# Patient Record
Sex: Male | Born: 1970 | Race: White | Hispanic: No | Smoking: Current every day smoker
Health system: Southern US, Community
[De-identification: ages and names within clinical notes are randomized; demographics above are authoritative.]

## PROBLEM LIST (undated history)

## (undated) HISTORY — PX: FRACTURE SURGERY: SHX138

---

## 2002-10-06 ENCOUNTER — Emergency Department (HOSPITAL_COMMUNITY): Admission: EM | Admit: 2002-10-06 | Discharge: 2002-10-06 | Payer: Self-pay | Admitting: *Deleted

## 2002-10-06 ENCOUNTER — Encounter: Payer: Self-pay | Admitting: *Deleted

## 2002-10-27 ENCOUNTER — Encounter: Payer: Self-pay | Admitting: Emergency Medicine

## 2002-10-27 ENCOUNTER — Ambulatory Visit (HOSPITAL_COMMUNITY): Admission: RE | Admit: 2002-10-27 | Discharge: 2002-10-27 | Payer: Self-pay | Admitting: Emergency Medicine

## 2003-05-31 ENCOUNTER — Emergency Department (HOSPITAL_COMMUNITY): Admission: EM | Admit: 2003-05-31 | Discharge: 2003-05-31 | Payer: Self-pay | Admitting: Emergency Medicine

## 2004-02-28 ENCOUNTER — Ambulatory Visit (HOSPITAL_COMMUNITY): Admission: RE | Admit: 2004-02-28 | Discharge: 2004-02-28 | Payer: Self-pay | Admitting: Internal Medicine

## 2014-10-29 ENCOUNTER — Emergency Department (HOSPITAL_COMMUNITY): Payer: Self-pay

## 2014-10-29 ENCOUNTER — Emergency Department (HOSPITAL_COMMUNITY): Payer: No Typology Code available for payment source

## 2014-10-29 ENCOUNTER — Emergency Department (HOSPITAL_COMMUNITY)
Admission: EM | Admit: 2014-10-29 | Discharge: 2014-10-29 | Disposition: A | Discharge status: Home / Self Care | Payer: Self-pay | Attending: Emergency Medicine | Admitting: Emergency Medicine

## 2014-10-29 ENCOUNTER — Encounter (HOSPITAL_COMMUNITY): Payer: Self-pay | Admitting: *Deleted

## 2014-10-29 DIAGNOSIS — Z720 Tobacco use: Secondary | ICD-10-CM | POA: Insufficient documentation

## 2014-10-29 DIAGNOSIS — M545 Low back pain, unspecified: Secondary | ICD-10-CM

## 2014-10-29 DIAGNOSIS — S3992XA Unspecified injury of lower back, initial encounter: Secondary | ICD-10-CM | POA: Insufficient documentation

## 2014-10-29 DIAGNOSIS — Y998 Other external cause status: Secondary | ICD-10-CM | POA: Insufficient documentation

## 2014-10-29 DIAGNOSIS — Y92410 Unspecified street and highway as the place of occurrence of the external cause: Secondary | ICD-10-CM | POA: Insufficient documentation

## 2014-10-29 DIAGNOSIS — Y9389 Activity, other specified: Secondary | ICD-10-CM | POA: Insufficient documentation

## 2014-10-29 MED ORDER — OXYCODONE-ACETAMINOPHEN 5-325 MG PO TABS
2.0000 | ORAL_TABLET | Freq: Once | ORAL | Status: AC
  Administered 2014-10-29: 2 via ORAL
  Filled 2014-10-29: qty 2

## 2014-10-29 MED ORDER — IBUPROFEN 400 MG PO TABS
600.0000 mg | ORAL_TABLET | Freq: Once | ORAL | Status: AC
  Administered 2014-10-29: 600 mg via ORAL
  Filled 2014-10-29: qty 2

## 2014-10-29 MED ORDER — TRAMADOL HCL 50 MG PO TABS: 50.0000 mg | ORAL_TABLET | Freq: Four times a day (QID) | ORAL | Status: DC | PRN

## 2014-10-29 MED ORDER — DIAZEPAM 5 MG PO TABS
5.0000 mg | ORAL_TABLET | Freq: Once | ORAL | Status: AC
  Administered 2014-10-29: 5 mg via ORAL
  Filled 2014-10-29: qty 1
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
## 2014-10-29 NOTE — ED Notes (Signed)
Patient transported to MRI 
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
## 2014-10-29 NOTE — ED Notes (Signed)
Pt in gown , given slippers for MRI.

## 2016-02-09 ENCOUNTER — Emergency Department (HOSPITAL_COMMUNITY)
Admission: EM | Admit: 2016-02-09 | Discharge: 2016-02-09 | Disposition: A | Discharge status: Home / Self Care | Payer: 59 | Attending: Emergency Medicine | Admitting: Emergency Medicine

## 2016-02-09 ENCOUNTER — Emergency Department (HOSPITAL_COMMUNITY): Payer: 59

## 2016-02-09 ENCOUNTER — Encounter (HOSPITAL_COMMUNITY): Payer: Self-pay | Admitting: Emergency Medicine

## 2016-02-09 DIAGNOSIS — M25561 Pain in right knee: Secondary | ICD-10-CM | POA: Insufficient documentation

## 2016-02-09 DIAGNOSIS — Y999 Unspecified external cause status: Secondary | ICD-10-CM | POA: Diagnosis not present

## 2016-02-09 DIAGNOSIS — Y939 Activity, unspecified: Secondary | ICD-10-CM | POA: Insufficient documentation

## 2016-02-09 DIAGNOSIS — S8991XA Unspecified injury of right lower leg, initial encounter: Secondary | ICD-10-CM | POA: Diagnosis present

## 2016-02-09 DIAGNOSIS — F17210 Nicotine dependence, cigarettes, uncomplicated: Secondary | ICD-10-CM | POA: Insufficient documentation

## 2016-02-09 DIAGNOSIS — X58XXXA Exposure to other specified factors, initial encounter: Secondary | ICD-10-CM | POA: Insufficient documentation

## 2016-02-09 DIAGNOSIS — Y929 Unspecified place or not applicable: Secondary | ICD-10-CM | POA: Diagnosis not present

## 2016-02-09 MED ORDER — IBUPROFEN 400 MG PO TABS: 400.0000 mg | ORAL_TABLET | Freq: Four times a day (QID) | ORAL | 0 refills | Status: AC | PRN

## 2016-02-09 MED ORDER — OXYCODONE-ACETAMINOPHEN 5-325 MG PO TABS
1.0000 | ORAL_TABLET | Freq: Three times a day (TID) | ORAL | 0 refills | Status: DC | PRN
Start: 2016-02-09 — End: 2016-02-11
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
## 2016-02-09 NOTE — ED Provider Notes (Signed)
[facility]-EMERGENCY DEPT Provider Note   CSN: [id number] Arrival date & time: 02/09/16  [id number]  History   Chief Complaint No chief complaint on file.   HPI Tyrone Rodriguez is a 45 y.o. male.  HPI  45 y.o. male presents to the Emergency Department today complaining of right knee pain x 1 week. Notes worsening of pain with ambulation. Minimal pain at rest. No fevers. No N/V. No numbness/tingling. Notes swelling occasionally, but this resolves. No redness around knee. States that on occasions he feels a "pop" in his knee and is able to "pop it back in" without difficulty and improvement of pain. Notes minimal relief with OTC remedies. No other symptoms noted.    No past medical history on file.  There are no active problems to display for this patient.   No past surgical history on file.     Home Medications    Prior to Admission medications   Medication Sig Start Date End Date Taking? Authorizing Provider  traMADol (ULTRAM) 50 MG tablet Take 1 tablet (50 mg total) by mouth every 6 (six) hours as needed. 10/29/14   Raeford RazorStephen Kohut, MD    Family History No family history on file.  Social History Social History  Substance Use Topics  . Smoking status: Current Every Day Smoker    Packs/day: 0.50    Types: Cigarettes  . Smokeless tobacco: Not on file  . Alcohol use Yes     Comment: occ     Allergies   Shellfish allergy   Review of Systems Review of Systems  Constitutional: Negative for fever.  Gastrointestinal: Negative for nausea and vomiting.  Musculoskeletal: Positive for arthralgias.  Skin: Negative for rash and wound.  Neurological: Negative for numbness.   Physical Exam Updated Vital Signs There were no vitals taken for this visit.  Physical Exam  Constitutional: He is oriented to person, place, and time. Vital signs are normal. He appears well-developed and well-nourished.  HENT:  Head: Normocephalic.  Right Ear: Hearing normal.  Left Ear: Hearing normal.    Eyes: Conjunctivae and EOM are normal. Pupils are equal, round, and reactive to light.  Neck: Normal range of motion. Neck supple.  Cardiovascular: Normal rate and regular rhythm.   Pulmonary/Chest: Effort normal.  Musculoskeletal:  Right knee with no obvious swelling. No palpable or visible deformities. No skin change. NVI. Motor/sensation intact. Negative anterior/poster drawer bilaterally. Negative ballottement test. No varus or valgus laxity. No crepitus. Pain with extension/flexion. Positive McMurray.  Neurological: He is alert and oriented to person, place, and time.  Skin: Skin is warm and dry.  Psychiatric: He has a normal mood and affect. His speech is normal and behavior is normal. Thought content normal.  Nursing note and vitals reviewed.  ED Treatments / Results  Labs (all labs ordered are listed, but only abnormal results are displayed) Labs Reviewed - No data to display  EKG  EKG Interpretation None       Radiology Dg Knee Complete 4 Views Right  Result Date: 02/09/2016 CLINICAL DATA:  Right knee pain for the past 3 days. No known injury. EXAM: RIGHT KNEE - COMPLETE 4+ VIEW COMPARISON:  None. FINDINGS: Intra medullary rod and screw fixation of the femur. Minimal spur formation involving all 3 joint compartments. Moderate-sized effusion. IMPRESSION: Minimal degenerative changes and moderate-sized effusion. Electronically Signed   By: Beckie SaltsSteven  Reid M.D.   On: 02/09/2016 10:21    Procedures Procedures (including critical care time)  Medications Ordered in ED Medications - No  data to display   Initial Impression / Assessment and Plan / ED Course  I have reviewed the triage vital signs and the nursing notes.  Pertinent labs & imaging results that were available during my care of the patient were reviewed by me and considered in my medical decision making (see chart for details).  Clinical Course    Final Clinical Impressions(s) / ED Diagnoses   {I have reviewed  and evaluated the relevant imaging studies.  {I have reviewed the relevant previous healthcare records.  {I obtained HPI from historian.   ED Course:  Assessment: Patient X-Ray negative for obvious fracture or dislocation. Likely meniscal tear on medial aspect. Pt advised to follow up with orthopedics. Patient given brace while in ED, conservative therapy recommended and discussed. Patient will be discharged home & is agreeable with above plan. Returns precautions discussed. Pt appears safe for discharge.  Disposition/Plan:  DC Home Additional Verbal discharge instructions given and discussed with patient.  Pt Instructed to f/u with Ortho in the next week for evaluation and treatment of symptoms. Return precautions given Pt acknowledges and agrees with plan  Supervising Physician Bethann BerkshireJoseph Zammit, MD  Final diagnoses:  Acute pain of right knee    New Prescriptions New Prescriptions   No medications on file     Audry Piliyler Saoirse Legere, PA-C 02/09/16 1053    Bethann BerkshireJoseph Zammit, MD 02/11/16 [phone]

## 2016-02-09 NOTE — ED Triage Notes (Signed)
Pt c/o right knee pain since Thursday. Denies injury.

## 2016-02-11 ENCOUNTER — Encounter: Payer: Self-pay | Admitting: Orthopaedic Surgery

## 2016-02-11 ENCOUNTER — Ambulatory Visit (INDEPENDENT_AMBULATORY_CARE_PROVIDER_SITE_OTHER): Payer: 59 | Admitting: Orthopaedic Surgery

## 2016-02-11 VITALS — BP 157/94 | HR 79 | Temp 97.2°F | Ht 70.0 in | Wt 278.0 lb

## 2016-02-11 DIAGNOSIS — M25561 Pain in right knee: Secondary | ICD-10-CM

## 2016-02-11 DIAGNOSIS — F17210 Nicotine dependence, cigarettes, uncomplicated: Secondary | ICD-10-CM

## 2016-02-11 MED ORDER — OXYCODONE-ACETAMINOPHEN 5-325 MG PO TABS: 1.0000 | ORAL_TABLET | Freq: Four times a day (QID) | ORAL | 0 refills | Status: DC | PRN

## 2016-02-11 NOTE — Progress Notes (Signed)
Subjective:    Patient ID: Tyrone Rodriguez, male    DOB: 1970/06/01, 45 y.o.   MRN: [id number]  HPI He took a misstep about 3 days ago and twisted his knee and fell.  He has had swelling and pain since then.  He was seen in the ER on 02-09-16.  X-rays were done and showed effusion but no fracture.  He was given pain medicine and ibuprofen.  His pain continues.  He is not able to fully extend his knee.  He is limping.  He has large effusion.  He has pain at rest.  He has no other injury.  I have reviewed the ER report, x-rays and x-ray report.  I feel he has a torn meniscus medially.  I have recommended a MRI.  He smokes and is not interested in stopping.  I have encouraged him to reconsider this.   Review of Systems  HENT: Negative for congestion.   Respiratory: Negative for cough and shortness of breath.   Cardiovascular: Negative for chest pain and leg swelling.  Endocrine: Negative for cold intolerance.  Musculoskeletal: Positive for arthralgias, gait problem and joint swelling.  Allergic/Immunologic: Negative for environmental allergies.   History reviewed. No pertinent past medical history.  Past Surgical History:  Procedure Laterality Date  . FRACTURE SURGERY      Current Outpatient Prescriptions on File Prior to Visit  Medication Sig Dispense Refill  . ibuprofen (ADVIL,MOTRIN) 400 MG tablet Take 1 tablet (400 mg total) by mouth every 6 (six) hours as needed. 30 tablet 0   No current facility-administered medications on file prior to visit.     Social History   Social History  . Marital status: Divorced    Spouse name: N/A  . Number of children: N/A  . Years of education: N/A   Occupational History  . Not on file.   Social History Main Topics  . Smoking status: Current Every Day Smoker    Packs/day: 0.50    Types: Cigarettes  . Smokeless tobacco: Never Used  . Alcohol use Yes     Comment: occ  . Drug use: No  . Sexual activity: Not on file   Other  Topics Concern  . Not on file   Social History Narrative  . No narrative on file    Family history of hypertension and heart disease.  BP (!) 157/94   Pulse 79   Temp 97.2 F (36.2 C)   Ht 5\' 10"  (1.778 m)   Wt 278 lb (126.1 kg)   BMI 39.89 kg/m       Objective:   Physical Exam  Constitutional: He is oriented to person, place, and time. He appears well-developed and well-nourished.  HENT:  Head: Normocephalic and atraumatic.  Eyes: Conjunctivae and EOM are normal. Pupils are equal, round, and reactive to light.  Neck: Normal range of motion. Neck supple.  Cardiovascular: Normal rate, regular rhythm and intact distal pulses.   Pulmonary/Chest: Effort normal.  Abdominal: Soft.  Musculoskeletal: He exhibits tenderness (Pain right knee with very large effusion.  ROM 5 to 90 with pain, unable to have full extension.  Limip to the right.  Positive medial McMurray.  Knee is stable.  Left knee negative.).  Neurological: He is alert and oriented to person, place, and time. He has normal reflexes. No cranial nerve deficit. He exhibits normal muscle tone. Coordination normal.  Skin: Skin is warm and dry.  Psychiatric: He has a normal mood and affect. His behavior is  normal. Judgment and thought content normal.          Assessment & Plan:   Encounter Diagnoses  Name Primary?  . Acute pain of right knee Yes  . Cigarette nicotine dependence without complication    PROCEDURE NOTE:  The patient request injection, verbal consent was obtained.  The right knee was prepped appropriately after time out was performed.   Sterile technique was observed and anesthesia was provided by ethyl chloride and a 20-gauge needle was used to inject the knee area.  A 16-gauge needle was then used to aspirate the knee.  Color of fluid aspirated was slightly blood tinged  Total cc's aspirated was 65.    Injection of 1 cc of Depo-Medrol 40 mg with several cc's of plain xylocaine was then  performed.  A band aid dressing was applied.  The patient was advised to apply ice later today and tomorrow to the injection sight as needed.   I feel he has a torn medial meniscus of the knee.  He has large effusion, inability to fully extend the knee even after aspiration of the knee, he has positive Medial McMurray, and recent injury.  I feel he needs MRI.  He will need surgery if the MRI supports my diagnosis.    He is to get some crutches.  I have given medicine for pain. Continue the Ibuprofen.  Return after the MRI.  Call if any problem.  Precautions given.  Electronically Signed Darreld McleanWayne Ireene Ballowe, MD 12/5/20172:24 PM

## 2016-02-11 NOTE — Patient Instructions (Signed)
Steps to Quit Smoking Smoking tobacco can be bad for your health. It can also affect almost every organ in your body. Smoking puts you and people around you at risk for many serious Sallye Lunz-lasting (chronic) diseases. Quitting smoking is hard, but it is one of the best things that you can do for your health. It is never too late to quit. What are the benefits of quitting smoking? When you quit smoking, you lower your risk for getting serious diseases and conditions. They can include:  Lung cancer or lung disease.  Heart disease.  Stroke.  Heart attack.  Not being able to have children (infertility).  Weak bones (osteoporosis) and broken bones (fractures). If you have coughing, wheezing, and shortness of breath, those symptoms may get better when you quit. You may also get sick less often. If you are pregnant, quitting smoking can help to lower your chances of having a baby of low birth weight. What can I do to help me quit smoking? Talk with your doctor about what can help you quit smoking. Some things you can do (strategies) include:  Quitting smoking totally, instead of slowly cutting back how much you smoke over a period of time.  Going to in-person counseling. You are more likely to quit if you go to many counseling sessions.  Using resources and support systems, such as:  Online chats with a counselor.  Phone quitlines.  Printed self-help materials.  Support groups or group counseling.  Text messaging programs.  Mobile phone apps or applications.  Taking medicines. Some of these medicines may have nicotine in them. If you are pregnant or breastfeeding, do not take any medicines to quit smoking unless your doctor says it is okay. Talk with your doctor about counseling or other things that can help you. Talk with your doctor about using more than one strategy at the same time, such as taking medicines while you are also going to in-person counseling. This can help make quitting  easier. What things can I do to make it easier to quit? Quitting smoking might feel very hard at first, but there is a lot that you can do to make it easier. Take these steps:  Talk to your family and friends. Ask them to support and encourage you.  Call phone quitlines, reach out to support groups, or work with a counselor.  Ask people who smoke to not smoke around you.  Avoid places that make you want (trigger) to smoke, such as:  Bars.  Parties.  Smoke-break areas at work.  Spend time with people who do not smoke.  Lower the stress in your life. Stress can make you want to smoke. Try these things to help your stress:  Getting regular exercise.  Deep-breathing exercises.  Yoga.  Meditating.  Doing a body scan. To do this, close your eyes, focus on one area of your body at a time from head to toe, and notice which parts of your body are tense. Try to relax the muscles in those areas.  Download or buy apps on your mobile phone or tablet that can help you stick to your quit plan. There are many free apps, such as QuitGuide from the CDC (Centers for Disease Control and Prevention). You can find more support from smokefree.gov and other websites. This information is not intended to replace advice given to you by your health care provider. Make sure you discuss any questions you have with your health care provider. Document Released: 12/20/2008 Document Revised: 10/22/2015 Document   Reviewed: 07/10/2014 Elsevier Interactive Patient Education  2017 [employer]  

## 2016-02-13 ENCOUNTER — Ambulatory Visit (HOSPITAL_COMMUNITY): Admission: RE | Admit: 2016-02-13 | Payer: 59 | Source: Ambulatory Visit

## 2016-02-18 ENCOUNTER — Ambulatory Visit: Payer: 59 | Admitting: Orthopaedic Surgery

## 2017-10-01 IMAGING — DX DG KNEE COMPLETE 4+V*R*
4 series · 4 of 4 positions shown · non-contrast
Comparison: None.

CLINICAL DATA: Right knee pain for the past 3 days. No known
injury.

EXAM:
RIGHT KNEE - COMPLETE 4+ VIEW

[knee ap]
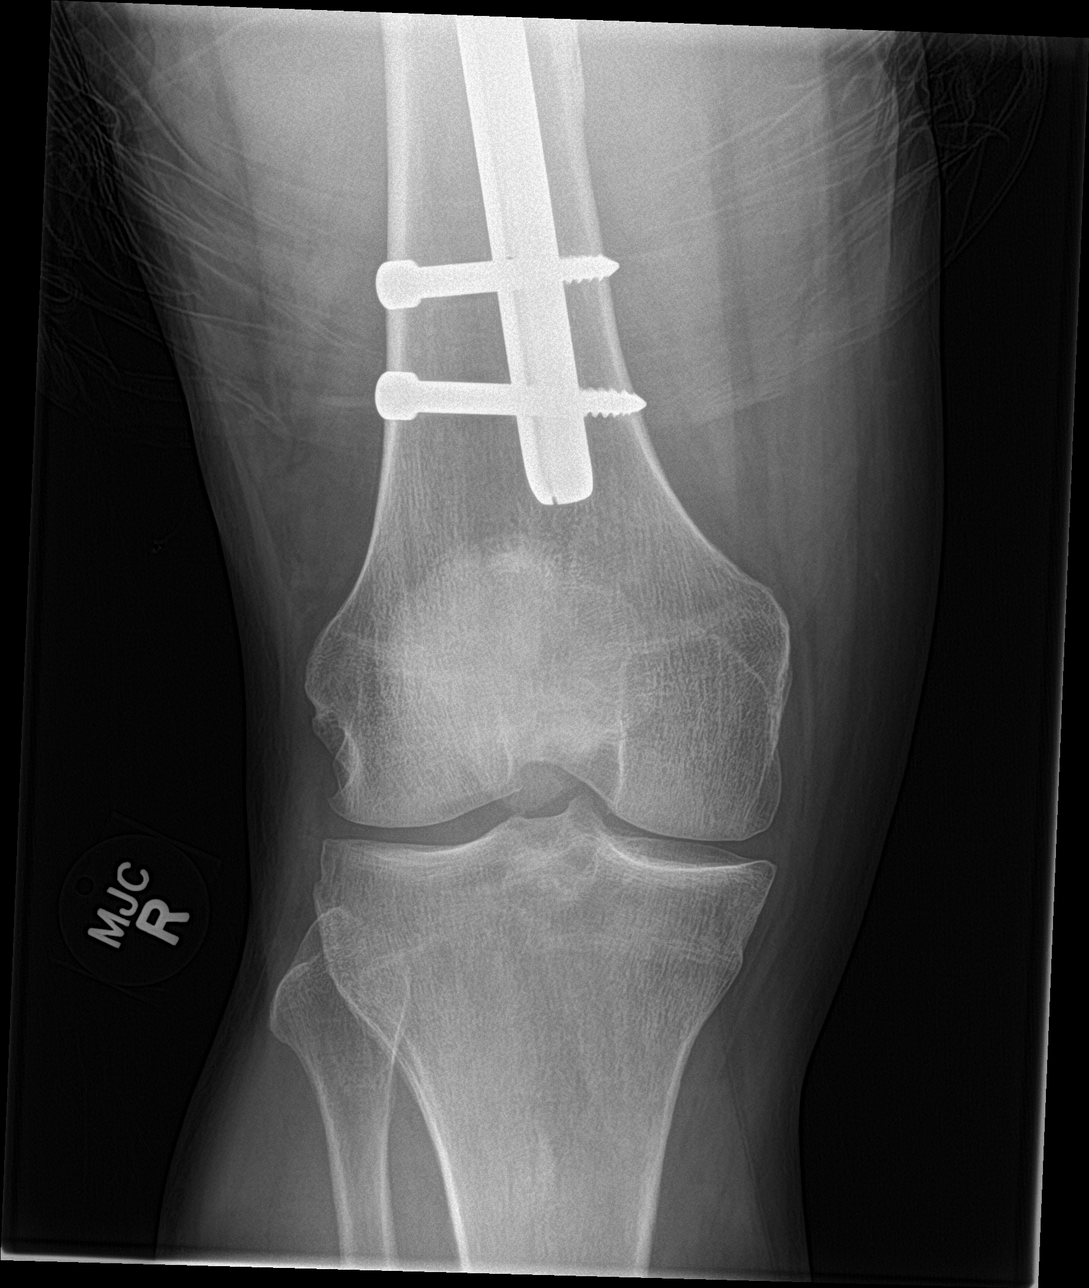

[tunnel]
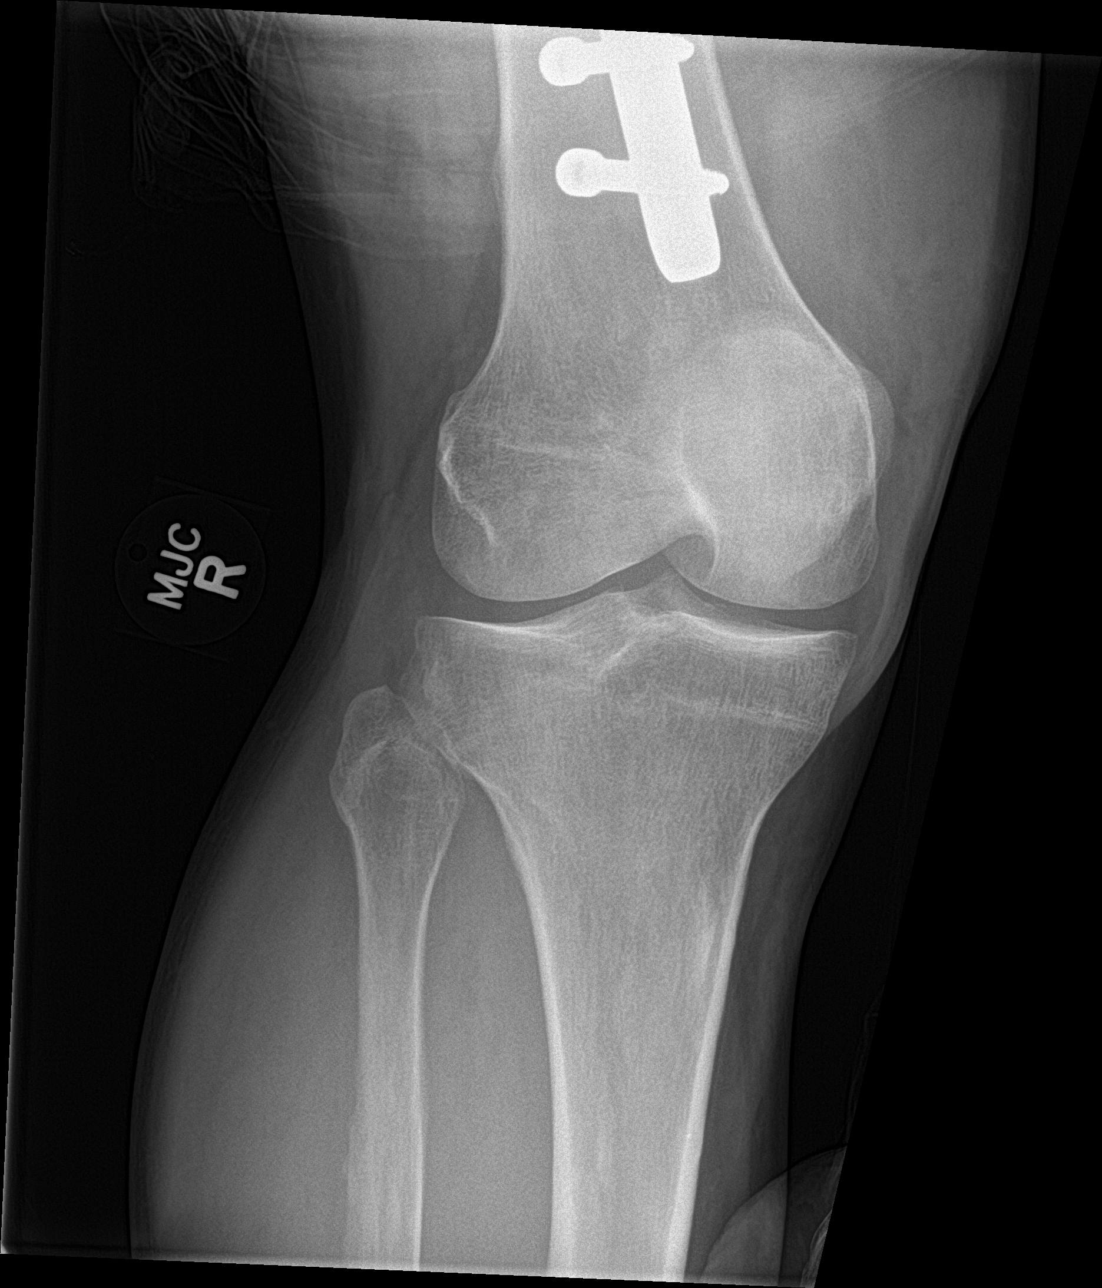

[knee lat]
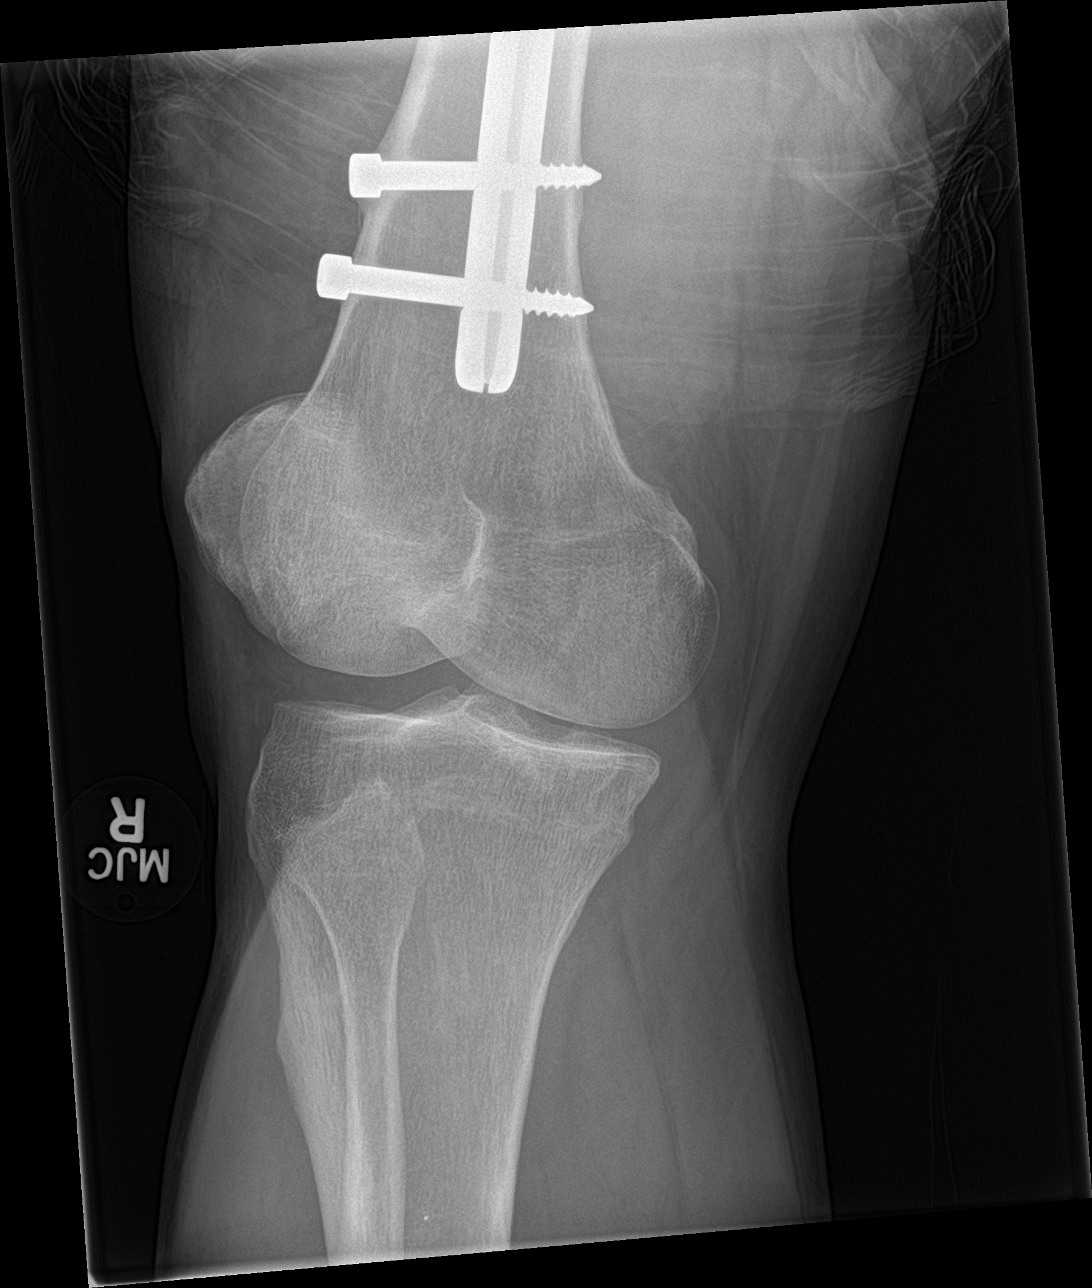

[knee sunrise]
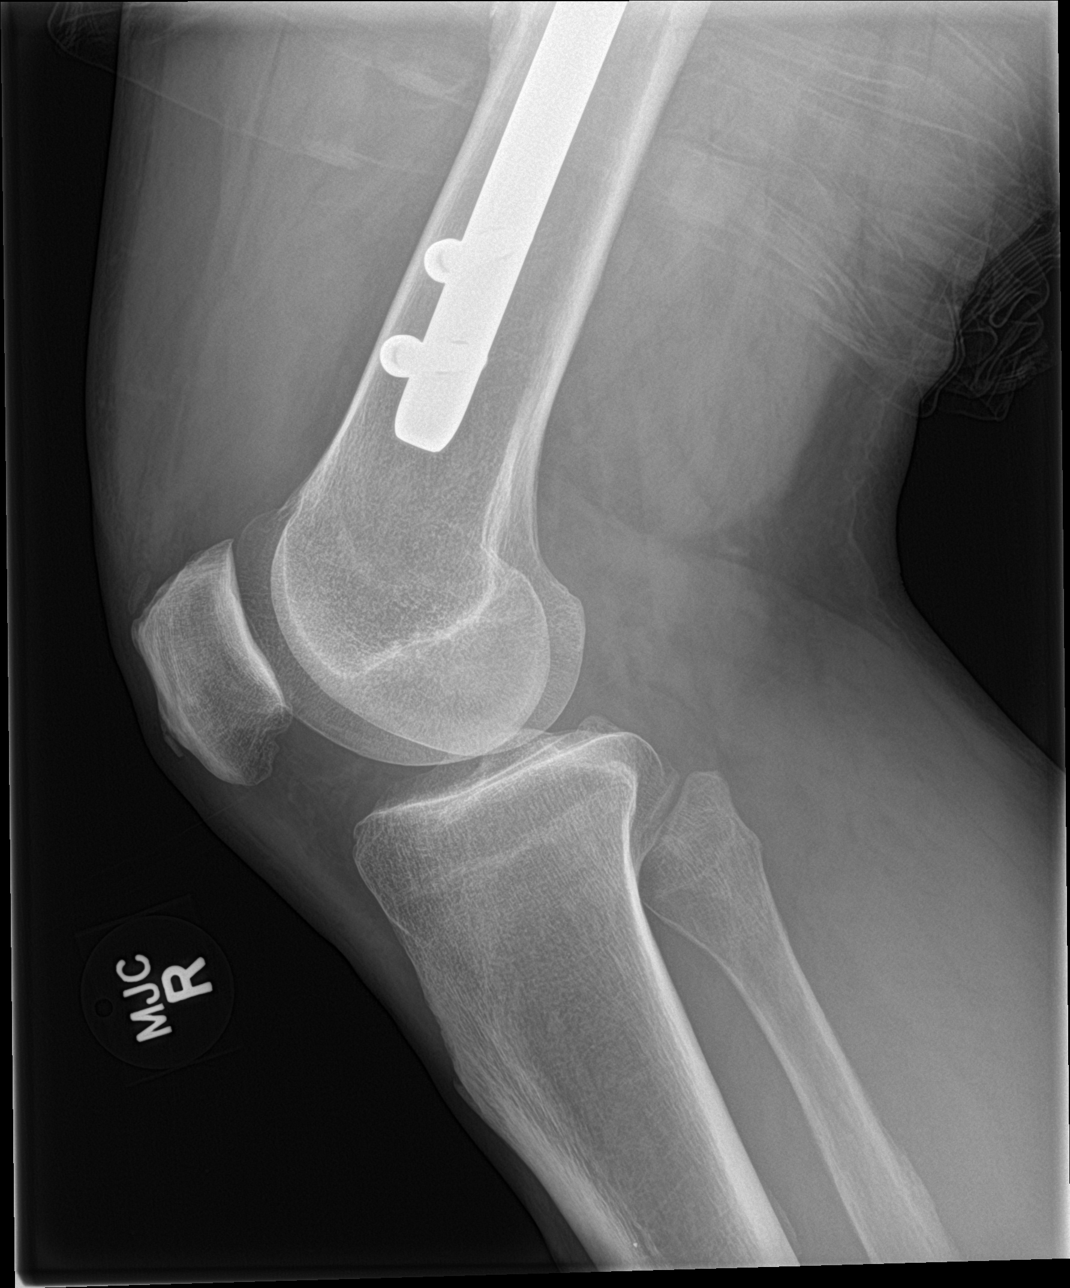

[4 of 4 positions shown; findings below may reference images not displayed]

FINDINGS: Intra medullary rod and screw fixation of the femur. Minimal spur
formation involving all 3 joint compartments. Moderate-sized
effusion.
IMPRESSION: Minimal degenerative changes and moderate-sized effusion.

## 2023-05-17 ENCOUNTER — Ambulatory Visit: Payer: 59 | Admitting: Family Medicine

## 2023-05-17 ENCOUNTER — Encounter: Payer: Self-pay | Admitting: Family Medicine

## 2023-05-17 VITALS — BP 144/86 | HR 82 | Temp 97.3°F | Ht 70.5 in | Wt 317.5 lb

## 2023-05-17 DIAGNOSIS — M25561 Pain in right knee: Secondary | ICD-10-CM

## 2023-05-17 DIAGNOSIS — G8929 Other chronic pain: Secondary | ICD-10-CM

## 2023-05-17 DIAGNOSIS — R030 Elevated blood-pressure reading, without diagnosis of hypertension: Secondary | ICD-10-CM | POA: Diagnosis not present

## 2023-05-17 DIAGNOSIS — Z1211 Encounter for screening for malignant neoplasm of colon: Secondary | ICD-10-CM

## 2023-05-17 DIAGNOSIS — Z23 Encounter for immunization: Secondary | ICD-10-CM

## 2023-05-17 DIAGNOSIS — Z0000 Encounter for general adult medical examination without abnormal findings: Secondary | ICD-10-CM | POA: Diagnosis not present

## 2023-05-17 DIAGNOSIS — Z0001 Encounter for general adult medical examination with abnormal findings: Secondary | ICD-10-CM

## 2023-05-17 DIAGNOSIS — F17210 Nicotine dependence, cigarettes, uncomplicated: Secondary | ICD-10-CM

## 2023-05-17 NOTE — Assessment & Plan Note (Signed)
 3-5 minute discussion regarding the harms of tobacco use, the benefits of cessation, and methods of cessation. Discussed that there are medication options to help with cessation. Provided printed education on steps to quit smoking. Patient is not ready to try a medication to help. Referral to pulm for lung cancer screening

## 2023-05-17 NOTE — Assessment & Plan Note (Addendum)
 BP 144/86 today. Not monitoring at home. Recommend heart healthy diet such as Mediterranean diet with whole grains, fruits, vegetable, fish, lean meats, nuts, and olive oil. Limit salt. Encouraged moderate walking, 3-5 times/week for 30-50 minutes each session. Aim for at least 150 minutes.week. Goal should be pace of 3 miles/hours, or walking 1.5 miles in 30 minutes. Avoid tobacco products. Avoid excess alcohol. Take medications as prescribed and bring medications and blood pressure log with cuff to each office visit. Seek medical care for chest pain, palpitations, shortness of breath with exertion, dizziness/lightheadedness, vision changes, recurrent headaches, or swelling of extremities. Monitor at home and return to office in 1 week for reassessment.
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
## 2023-05-17 NOTE — Assessment & Plan Note (Signed)
 Previously evaluated by ortho many years ago. X-ray negative. MRI recommended, will order and refer back to Ortho Surg. No signs of effusion on exam today however he does have pain with ROM.

## 2023-05-17 NOTE — Patient Instructions (Signed)
 It was great to meet you today and I'm excited to have you join the [facility] practice. I hope you had a positive experience today! If you feel so inclined, please feel free to recommend our practice to friends and family. Kurtis Bushman, FNP-C

## 2023-05-17 NOTE — Assessment & Plan Note (Addendum)
 Counseled on importance of weight management for overall health. Encouraged low calorie, heart healthy diet and moderate intensity exercise 150 minutes weekly, this is limited by his right knee pain. This is 3-5 times weekly for 30-50 minutes each session. Goal should be pace of 3 miles/hours, or walking 1.5 miles in 30 minutes and include strength training. Discussed risks of obesity. Will refer to MWM.

## 2023-05-17 NOTE — Assessment & Plan Note (Signed)

## 2023-05-18 LAB — CBC WITH DIFFERENTIAL/PLATELET
Absolute Lymphocytes: 2797 {cells}/uL (ref 850–3900)
Absolute Monocytes: 875 {cells}/uL (ref 200–950)
Basophils Absolute: 108 {cells}/uL (ref 0–200)
Basophils Relative: 1 %
Eosinophils Absolute: 616 {cells}/uL — ABNORMAL HIGH (ref 15–500)
Eosinophils Relative: 5.7 %
HCT: 49.8 % (ref 38.5–50.0)
Hemoglobin: 15.6 g/dL (ref 13.2–17.1)
MCH: 26.4 pg — ABNORMAL LOW (ref 27.0–33.0)
MCHC: 31.3 g/dL — ABNORMAL LOW (ref 32.0–36.0)
MCV: 84.4 fL (ref 80.0–100.0)
MPV: 9.7 fL (ref 7.5–12.5)
Monocytes Relative: 8.1 %
Neutro Abs: 6404 {cells}/uL (ref 1500–7800)
Neutrophils Relative %: 59.3 %
Platelets: 370 10*3/uL (ref 140–400)
RBC: 5.9 10*6/uL — ABNORMAL HIGH (ref 4.20–5.80)
RDW: 13.3 % (ref 11.0–15.0)
Total Lymphocyte: 25.9 %
WBC: 10.8 10*3/uL (ref 3.8–10.8)

## 2023-05-18 LAB — COMPLETE METABOLIC PANEL WITH GFR
AG Ratio: 1.6 (calc) (ref 1.0–2.5)
ALT: 26 U/L (ref 9–46)
AST: 25 U/L (ref 10–35)
Albumin: 4.2 g/dL (ref 3.6–5.1)
Alkaline phosphatase (APISO): 76 U/L (ref 35–144)
BUN: 15 mg/dL (ref 7–25)
CO2: 27 mmol/L (ref 20–32)
Calcium: 9.2 mg/dL (ref 8.6–10.3)
Chloride: 102 mmol/L (ref 98–110)
Creat: 0.81 mg/dL (ref 0.70–1.30)
Globulin: 2.7 g/dL (ref 1.9–3.7)
Glucose, Bld: 81 mg/dL (ref 65–99)
Potassium: 4.8 mmol/L (ref 3.5–5.3)
Sodium: 138 mmol/L (ref 135–146)
Total Bilirubin: 0.3 mg/dL (ref 0.2–1.2)
Total Protein: 6.9 g/dL (ref 6.1–8.1)
eGFR: 105 mL/min/{1.73_m2} (ref >=60)

## 2023-05-18 LAB — LIPID PANEL
Cholesterol: 159 mg/dL (ref <200)
HDL: 45 mg/dL (ref >=40)
LDL Cholesterol (Calc): 86 mg/dL
Non-HDL Cholesterol (Calc): 114 mg/dL (ref <130)
Total CHOL/HDL Ratio: 3.5 (calc) (ref <5.0)
Triglycerides: 188 mg/dL — ABNORMAL HIGH (ref <150)

## 2023-05-18 LAB — TSH: TSH: 1.79 m[IU]/L (ref 0.40–4.50)

## 2023-05-18 LAB — HEMOGLOBIN A1C
Hgb A1c MFr Bld: 6.5 %{Hb} — ABNORMAL HIGH (ref <5.7)
Mean Plasma Glucose: 140 mg/dL
eAG (mmol/L): 7.7 mmol/L

## 2023-05-24 ENCOUNTER — Telehealth: Payer: Self-pay | Admitting: Family Medicine

## 2023-05-24 ENCOUNTER — Encounter: Payer: Self-pay | Admitting: Family Medicine

## 2023-05-24 ENCOUNTER — Other Ambulatory Visit: Payer: Self-pay | Admitting: Family Medicine

## 2023-05-24 ENCOUNTER — Ambulatory Visit: Admitting: Family Medicine

## 2023-05-24 VITALS — BP 140/84 | HR 95 | Temp 97.6°F | Ht 70.5 in | Wt 314.4 lb

## 2023-05-24 DIAGNOSIS — E1165 Type 2 diabetes mellitus with hyperglycemia: Secondary | ICD-10-CM | POA: Diagnosis not present

## 2023-05-24 DIAGNOSIS — Z6841 Body Mass Index (BMI) 40.0 and over, adult: Secondary | ICD-10-CM

## 2023-05-24 DIAGNOSIS — R030 Elevated blood-pressure reading, without diagnosis of hypertension: Secondary | ICD-10-CM

## 2023-05-24 MED ORDER — SEMAGLUTIDE(0.25 OR 0.5MG/DOS) 2 MG/3ML ~~LOC~~ SOPN: PEN_INJECTOR | SUBCUTANEOUS | 1 refills | Status: DC

## 2023-05-24 MED ORDER — TRULICITY 0.75 MG/0.5ML ~~LOC~~ SOAJ: SUBCUTANEOUS | 0 refills | Status: AC

## 2023-05-24 NOTE — Telephone Encounter (Signed)
 Marland Kitchen

## 2023-05-24 NOTE — Assessment & Plan Note (Signed)
 A1c 6.5%. Using shared decision making decided will start Ozempic 0.25mg  weekly and increase as tolerated. Referral to diabetes education. Will check fasting BG daily and bring with him to next visit. Follow up in 4 weeks to assess tolerance of Ozempic.A1c 6.5 and uACR needed. Foot exam due. Vaccines needs PNA. Retinal eye exam due. Recommend heart healthy diet such as Mediterranean diet with whole grains, fruits, vegetable, fish, lean meats, nuts, and olive oil. Limit salt. Encouraged moderate walking, 3-5 times/week for 30-50 minutes each session. Aim for at least 150 minutes.week. Goal should be pace of 3 miles/hours, or walking 1.5 miles in 30 minutes. Seek medical care for urinary frequency, extreme thirst, vision changes, lightheadedness, dizziness.  Follow up in 4 weeks or sooner if needed

## 2023-05-24 NOTE — Assessment & Plan Note (Signed)
 Improved. Would like to work on lifestyle He is making dietary changes. Will increase physical activity when able. Starting Ozempic. Will recheck at next OV in 1 month

## 2023-05-24 NOTE — Progress Notes (Signed)
 Subjective:  HPI: Tyrone Rodriguez is a 53 y.o. male presenting on 05/24/2023 for Follow-up (1 week follow up. Pt has not heard from referrals for Ortho and Pulmonology. )   HPI Patient is in today for blood pressure follow up. He has reduced his soda intake from 1.2-2 x 2 liters per day and increasing his water intake, limiting his sugar intake. He is working on getting his right knee repaired so he can walk and exercise.  Recent labs showed A1c 6.5% and elevated triglycerides. No personal history of pancreatitis or personal or family history of MEN2 or MTC.  HYPERTENSION without Chronic Kidney Disease Hypertension status: better  Satisfied with current treatment?  unmedicated Duration of hypertension:  new BP monitoring frequency:  a few times a week BP range: does not recall BP medication side effects:  n/a Medication compliance: n/a Previous BP meds:none Aspirin: no Recurrent headaches: no Visual changes: no Palpitations: no Dyspnea: no Chest pain: no Lower extremity edema: no Dizzy/lightheaded: no  DIABETES Hypoglycemic episodes:no Polydipsia/polyuria: no Visual disturbance: no Chest pain: no Paresthesias: no Glucose Monitoring: yes  Accucheck frequency:  few times a week  Fasting glucose:  Post prandial:  Evening:  Before meals: Taking Insulin?: no  Long acting insulin:  Short acting insulin: Blood Pressure Monitoring: a few times a week Retinal Examination: Not up to Date Foot Exam: Not up to Date Diabetic Education: Not Completed Pneumovax: Not up to Date Influenza: Up to Date Aspirin: no   Review of Systems  All other systems reviewed and are negative.   Relevant past medical history reviewed and updated as indicated.   History reviewed. No pertinent past medical history.   Past Surgical History:  Procedure Laterality Date   FRACTURE SURGERY      Allergies and medications reviewed and updated.   Current Outpatient Medications:    ibuprofen  (ADVIL,MOTRIN) 400 MG tablet, Take 1 tablet (400 mg total) by mouth every 6 (six) hours as needed., Disp: 30 tablet, Rfl: 0   Semaglutide,0.25 or 0.5MG /DOS, 2 MG/3ML SOPN, Inject 0.25 mg into the skin once a week for 28 days, THEN 0.5 mg once a week for 28 days., Disp: 3 mL, Rfl: 1  No Active Allergies  Objective:   BP (!) 140/84   Pulse 95   Temp 97.6 F (36.4 C)   Ht 5' 10.5" (1.791 m)   Wt (!) 314 lb 6.4 oz (142.6 kg)   SpO2 96%   BMI 44.47 kg/m      05/24/2023    2:16 PM 05/17/2023   10:16 AM 02/11/2016    1:48 PM  Vitals with BMI  Height 5' 10.5" 5' 10.5" 5\' 10"   Weight 314 lbs 6 oz 317 lbs 8 oz 278 lbs  BMI 44.46 44.9 40  Systolic 140 144 409  Diastolic 84 86 94  Pulse 95 82 79     Physical Exam Vitals and nursing note reviewed.  Constitutional:      Appearance: Normal appearance. He is normal weight.  HENT:     Head: Normocephalic and atraumatic.  Cardiovascular:     Rate and Rhythm: Normal rate and regular rhythm.     Pulses: Normal pulses.     Heart sounds: Normal heart sounds.  Pulmonary:     Effort: Pulmonary effort is normal.     Breath sounds: Normal breath sounds.  Skin:    General: Skin is warm and dry.     Capillary Refill: Capillary refill takes less than 2  seconds.  Neurological:     General: No focal deficit present.     Mental Status: He is alert and oriented to person, place, and time. Mental status is at baseline.  Psychiatric:        Mood and Affect: Mood normal.        Behavior: Behavior normal.        Thought Content: Thought content normal.        Judgment: Judgment normal.     Assessment & Plan:  Elevated blood pressure reading in office without diagnosis of hypertension Assessment & Plan: Improved. Would like to work on lifestyle He is making dietary changes. Will increase physical activity when able. Starting Ozempic. Will recheck at next OV in 1 month   Morbid obesity (HCC)  Type 2 diabetes mellitus with hyperglycemia,  without long-term current use of insulin (HCC) Assessment & Plan: A1c 6.5%. Using shared decision making decided will start Ozempic 0.25mg  weekly and increase as tolerated. Referral to diabetes education. Will check fasting BG daily and bring with him to next visit. Follow up in 4 weeks to assess tolerance of Ozempic.A1c 6.5 and uACR needed. Foot exam due. Vaccines needs PNA. Retinal eye exam due. Recommend heart healthy diet such as Mediterranean diet with whole grains, fruits, vegetable, fish, lean meats, nuts, and olive oil. Limit salt. Encouraged moderate walking, 3-5 times/week for 30-50 minutes each session. Aim for at least 150 minutes.week. Goal should be pace of 3 miles/hours, or walking 1.5 miles in 30 minutes. Seek medical care for urinary frequency, extreme thirst, vision changes, lightheadedness, dizziness.  Follow up in 4 weeks or sooner if needed  Orders: -     Amb Referral to Nutrition and Diabetic Education -     Microalbumin / creatinine urine ratio  Other orders -     Semaglutide(0.25 or 0.5MG /DOS); Inject 0.25 mg into the skin once a week for 28 days, THEN 0.5 mg once a week for 28 days.  Dispense: 3 mL; Refill: 1     Follow up plan: Return in about 4 weeks (around 06/21/2023) for diabetes, hypertension.  Park Meo, FNP

## 2023-05-26 ENCOUNTER — Other Ambulatory Visit: Payer: Self-pay | Admitting: Family Medicine

## 2023-05-26 NOTE — Progress Notes (Signed)
 MRI peer-to-peer completed. Authorized through Jul 17, 2023, number [id number]

## 2023-06-05 ENCOUNTER — Ambulatory Visit (HOSPITAL_COMMUNITY): Admission: RE | Admit: 2023-06-05 | Source: Ambulatory Visit

## 2023-06-09 ENCOUNTER — Encounter: Payer: Self-pay | Admitting: Emergency Medicine

## 2023-06-21 ENCOUNTER — Ambulatory Visit: Admitting: Family Medicine

## 2023-06-29 NOTE — Telephone Encounter (Signed)
 Erroneous encounter. Please disregard.
